# Patient Record
Sex: Female | Born: 2005 | Race: White | Hispanic: No | Marital: Single | Smoking: Never smoker
Health system: Southern US, Community
[De-identification: ages and names within clinical notes are randomized; demographics above are authoritative.]

## PROBLEM LIST (undated history)

## (undated) DIAGNOSIS — J45909 Unspecified asthma, uncomplicated: Secondary | ICD-10-CM

## (undated) DIAGNOSIS — G43909 Migraine, unspecified, not intractable, without status migrainosus: Secondary | ICD-10-CM

## (undated) DIAGNOSIS — I10 Essential (primary) hypertension: Secondary | ICD-10-CM

## (undated) HISTORY — DX: Unspecified asthma, uncomplicated: J45.909

## (undated) HISTORY — DX: Essential (primary) hypertension: I10

## (undated) HISTORY — DX: Migraine, unspecified, not intractable, without status migrainosus: G43.909

---
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
## 2024-03-08 DIAGNOSIS — S134XXA Sprain of ligaments of cervical spine, initial encounter: Secondary | ICD-10-CM | POA: Diagnosis not present

## 2024-03-08 DIAGNOSIS — W19XXXA Unspecified fall, initial encounter: Secondary | ICD-10-CM | POA: Diagnosis not present

## 2024-03-08 DIAGNOSIS — M542 Cervicalgia: Secondary | ICD-10-CM | POA: Diagnosis not present

## 2024-03-08 DIAGNOSIS — S300XXA Contusion of lower back and pelvis, initial encounter: Secondary | ICD-10-CM | POA: Diagnosis not present

## 2024-04-14 ENCOUNTER — Ambulatory Visit: Admitting: Physician Assistant

## 2024-04-16 ENCOUNTER — Ambulatory Visit (INDEPENDENT_AMBULATORY_CARE_PROVIDER_SITE_OTHER): Admitting: Physician Assistant

## 2024-04-16 ENCOUNTER — Encounter: Payer: Self-pay | Admitting: Physician Assistant

## 2024-04-16 VITALS — BP 106/62 | HR 78 | Temp 97.9°F | Ht 63.02 in | Wt 110.0 lb

## 2024-04-16 DIAGNOSIS — R627 Adult failure to thrive: Secondary | ICD-10-CM

## 2024-04-16 DIAGNOSIS — R899 Unspecified abnormal finding in specimens from other organs, systems and tissues: Secondary | ICD-10-CM

## 2024-04-16 NOTE — Progress Notes (Signed)
 Subjective:  Patient ID: Mckenzie Lucero, female    DOB: [birth year]  Age: 18 y.o. MRN: [id number]  Chief Complaint  Patient presents with   Weight Gain    HPI Pt in today for consult about weight - she states she would like to gain weight.  She has always had a petite frame and concerned about her size.  She eats 3 meals and one snack per day - does not always make healthy choices Last fall it was noted her hgb A1c was 5.8 but was eating very poorly - since then it has gone back to normal after cleaning up diet Weight today is 110 - weight in January 112    04/16/2024    1:11 PM 12/26/2023    9:15 AM 10/26/2023    9:08 AM 10/11/2023    2:05 PM 04/30/2023    3:32 PM  Depression screen PHQ 2/9  Decreased Interest 0 0 0 0 0  Down, Depressed, Hopeless 0 0 0 0 0  PHQ - 2 Score 0 0 0 0 0  Altered sleeping   0 0 0  Tired, decreased energy   1 0 1  Change in appetite   0 0 0  Feeling bad or failure about yourself    0 0 0  Trouble concentrating   0 0 0  Moving slowly or fidgety/restless   0 0 0  Suicidal thoughts   0 0   PHQ-9 Score   1 0 1  Difficult doing work/chores   Not difficult at all Not difficult at all         06/22/2022    7:43 AM 06/27/2023    9:56 AM 10/11/2023    2:05 PM 12/26/2023    9:15 AM 04/16/2024    1:11 PM  Fall Risk  Falls in the past year? 0 0 0 0 0  Was there an injury with Fall? 0 0 0 0 0  Fall Risk Category Calculator 0 0 0 0 0  Fall Risk Category (Retired) Low       (RETIRED) Patient Fall Risk Level Low fall risk       Patient at Risk for Falls Due to No Fall Risks No Fall Risks No Fall Risks No Fall Risks No Fall Risks  Fall risk Follow up Falls evaluation completed  Falls evaluation completed Falls evaluation completed  Falls evaluation completed     Data saved with a previous flowsheet row definition     ROS CONSTITUTIONAL: Negative for chills, fatigue, fever,  CARDIOVASCULAR: Negative for chest pain,  RESPIRATORY: Negative for recent cough and  dyspnea.  GASTROINTESTINAL: Negative for abdominal pain, acid reflux symptoms, constipation, diarrhea, nausea and vomiting.  PSYCHIATRIC: Negative for sleep disturbance and to question depression screen.  Negative for depression, negative for anhedonia.    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   Asthma    Blood pressure elevated without history of HTN 03/30/2017   Chronic constipation  06/13/2021   Gastroesophageal reflux disease 12/02/2015   History of 2019 novel coronavirus disease (COVID-19) 05/28/2020   Hypertension    Hypoglycemia 08/07/2023   Iron deficiency anemia due to chronic blood loss 03/21/2022   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   Menorrhagia with irregular cycle 03/21/2022   Migraine    Palpitations 08/07/2023   Recurrent vomiting 06/13/2021   Refused influenza vaccine 07/28/2020   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   Objective:  PHYSICAL EXAM:   BP 106/62   Pulse 78   Temp 97.9 F (36.6 C)   Ht 5' 3.02 (1.601 m)   Wt 110 lb (49.9 kg)   SpO2 98%   BMI 19.47 kg/m    GEN: Well nourished, well developed, in no acute distress  Cardiac: RRR; no murmurs,  Respiratory:  normal respiratory rate and pattern with no distress - normal breath sounds with no rales, rhonchi, wheezes or rubs GI: normal bowel sounds, no masses or tenderness Skin: warm and dry, no rash  Psych: euthymic mood, appropriate affect and demeanor  Assessment & Plan:    Poor  weight gain in adult -     CBC with Differential/Platelet -     Comprehensive metabolic panel with GFR -     TSH -     Hemoglobin A1c -     B12 and Folate Panel -     Iron, TIBC and Ferritin Panel -     Amb ref to Medical Nutrition Therapy-MNT  Abnormal laboratory test -     CBC with Differential/Platelet -     Comprehensive metabolic panel with GFR -     TSH -     Hemoglobin A1c -     B12 and Folate Panel -     Iron, TIBC and Ferritin Panel   Recommend eat more often and incorporate increase in protein in diet  Follow-up: Return in about 2 months (around 06/17/2024) for physical -- after 9/26.  An After Visit Summary was printed and given to the patient.  [name] [facility] [phone]

## 2024-04-17 ENCOUNTER — Ambulatory Visit: Payer: Self-pay | Admitting: Physician Assistant

## 2024-04-17 LAB — CBC WITH DIFFERENTIAL/PLATELET
Basophils Absolute: 0 x10E3/uL (ref 0.0–0.2)
Basos: 1 %
EOS (ABSOLUTE): 0.1 x10E3/uL (ref 0.0–0.4)
Eos: 1 %
Hematocrit: 40.7 % (ref 34.0–46.6)
Hemoglobin: 13.1 g/dL (ref 11.1–15.9)
Immature Grans (Abs): 0 x10E3/uL (ref 0.0–0.1)
Immature Granulocytes: 0 %
Lymphocytes Absolute: 2 x10E3/uL (ref 0.7–3.1)
Lymphs: 34 %
MCH: 29.2 pg (ref 26.6–33.0)
MCHC: 32.2 g/dL (ref 31.5–35.7)
MCV: 91 fL (ref 79–97)
Monocytes Absolute: 0.7 x10E3/uL (ref 0.1–0.9)
Monocytes: 11 %
Neutrophils Absolute: 3 x10E3/uL (ref 1.4–7.0)
Neutrophils: 53 %
Platelets: 230 x10E3/uL (ref 150–450)
RBC: 4.49 x10E6/uL (ref 3.77–5.28)
RDW: 11.8 % (ref 11.7–15.4)
WBC: 5.8 x10E3/uL (ref 3.4–10.8)

## 2024-04-17 LAB — COMPREHENSIVE METABOLIC PANEL WITH GFR
ALT: 10 IU/L (ref 0–32)
AST: 14 IU/L (ref 0–40)
Albumin: 4.6 g/dL (ref 4.0–5.0)
Alkaline Phosphatase: 100 IU/L (ref 42–106)
BUN/Creatinine Ratio: 9 (ref 9–23)
BUN: 7 mg/dL (ref 6–20)
Bilirubin Total: 0.3 mg/dL (ref 0.0–1.2)
CO2: 22 mmol/L (ref 20–29)
Calcium: 9.4 mg/dL (ref 8.7–10.2)
Chloride: 104 mmol/L (ref 96–106)
Creatinine, Ser: 0.8 mg/dL (ref 0.57–1.00)
Globulin, Total: 2.8 g/dL (ref 1.5–4.5)
Glucose: 82 mg/dL (ref 70–99)
Potassium: 3.8 mmol/L (ref 3.5–5.2)
Sodium: 141 mmol/L (ref 134–144)
Total Protein: 7.4 g/dL (ref 6.0–8.5)
eGFR: 109 mL/min/1.73 (ref >59)

## 2024-04-17 LAB — B12 AND FOLATE PANEL
Folate: 7.1 ng/mL (ref >3.0)
Vitamin B-12: 328 pg/mL (ref 232–1245)

## 2024-04-17 LAB — IRON,TIBC AND FERRITIN PANEL
Ferritin: 19 ng/mL (ref 15–77)
Iron Saturation: 23 % (ref 15–55)
Iron: 71 ug/dL (ref 27–159)
Total Iron Binding Capacity: 307 ug/dL (ref 250–450)
UIBC: 236 ug/dL (ref 131–425)

## 2024-04-17 LAB — HEMOGLOBIN A1C
Est. average glucose Bld gHb Est-mCnc: 111 mg/dL
Hgb A1c MFr Bld: 5.5 % (ref 4.8–5.6)

## 2024-04-17 LAB — TSH: TSH: 0.802 u[IU]/mL (ref 0.450–4.500)

## 2024-05-26 ENCOUNTER — Encounter: Payer: Self-pay | Admitting: Physician Assistant

## 2024-05-26 ENCOUNTER — Ambulatory Visit (INDEPENDENT_AMBULATORY_CARE_PROVIDER_SITE_OTHER): Admitting: Physician Assistant

## 2024-05-26 ENCOUNTER — Telehealth: Payer: Self-pay

## 2024-05-26 VITALS — BP 128/74 | HR 69 | Temp 97.2°F | Ht 63.02 in | Wt 112.0 lb

## 2024-05-26 DIAGNOSIS — M62830 Muscle spasm of back: Secondary | ICD-10-CM | POA: Insufficient documentation

## 2024-05-26 MED ORDER — CYCLOBENZAPRINE HCL 5 MG PO TABS
5.0000 mg | ORAL_TABLET | Freq: Three times a day (TID) | ORAL | 1 refills | Status: DC | PRN
Start: 2024-05-26 — End: 2024-05-26

## 2024-05-26 MED ORDER — CYCLOBENZAPRINE HCL 5 MG PO TABS: 5.0000 mg | ORAL_TABLET | Freq: Three times a day (TID) | ORAL | 1 refills | Status: AC | PRN

## 2024-05-26 MED ORDER — PREDNISONE 20 MG PO TABS: ORAL_TABLET | ORAL | 0 refills | Status: DC

## 2024-05-26 MED ORDER — PREDNISONE 20 MG PO TABS: ORAL_TABLET | ORAL | 0 refills | Status: AC

## 2024-05-26 NOTE — Assessment & Plan Note (Signed)
 Acute muscle spasm with nerve impingement in the right upper back and neck, causing numbness and tingling. Likely due to muscle spasm affecting the long thoracic nerve. Possible contributing factors include dehydration or electrolyte imbalance. - Prescribed low-dose muscle relaxer for nighttime use. - Advised on hydration to prevent muscle cramps. - Recommended alternating heat and cold therapy for pain and inflammation management. - Consider physical therapy if symptoms persist or recur. - Prescribed five-day prednisone  taper for nerve inflammation. - Advised on self-massage techniques with foam roller or tennis ball. - Sent prescriptions to [facility] on [address].

## 2024-05-26 NOTE — Addendum Note (Signed)
 Addended by: Derrick Orris M on: 05/26/2024 01:34 PM   Modules accepted: Orders

## 2024-05-26 NOTE — Telephone Encounter (Signed)
 error

## 2024-05-26 NOTE — Progress Notes (Signed)
 Acute Office Visit  Subjective:    Patient ID: Mckenzie Lucero, female    DOB: 25-Dec-2005, 18 y.o.   MRN: [id number]  Chief Complaint  Patient presents with   Left arm numbness    HPI: Patient is in today for left arm numbness Discussed the use of AI scribe software for clinical note transcription with the patient, who gave verbal consent to proceed.  History of Present Illness   Mckenzie Lucero is an 18 year old female who presents with numbness and tingling in the shoulder and arm. She is accompanied by a family member.  She began experiencing numbness and tingling in her shoulder and arm yesterday. The sensation, described as 'pins and needles', starts around her shoulder blade and extends down her arm into her fingers. No recent injury or unusual physical activity that might have triggered the symptoms.  She attempted to alleviate the symptoms with ibuprofen, Tylenol , and a topical cream, but these did not provide relief. No previous episodes of similar symptoms and reports a sensation of tightness in the affected area.  No chest pain, shortness of breath, dizziness, or thyroid  issues. No history of neck injury.       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   Asthma    Blood pressure elevated without history of HTN 03/30/2017   Chronic constipation 06/13/2021   Gastroesophageal reflux disease 12/02/2015   History of 2019 novel coronavirus disease (COVID-19) 05/28/2020   Hypertension    Hypoglycemia 08/07/2023   Iron deficiency anemia due to chronic blood loss 03/21/2022   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   Menorrhagia with irregular cycle 03/21/2022   Migraine    Palpitations 08/07/2023   Recurrent vomiting 06/13/2021   Refused influenza vaccine 07/28/2020   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    Past Surgical History:  Procedure Laterality Date   COLONOSCOPY   12/27/2021   Ruled Out Crohns   UPPER GI ENDOSCOPY  12/27/2021    Family History  Problem Relation Age of Onset   Migraines Mother    Heart murmur Mother    Migraines Father    Heart murmur Father    Depression Father    Stroke Other    Heart attack Other     Social History   Socioeconomic History   Marital status: Single    Spouse name: Not on file   Number of children: Not on file   Years of education: Not on file   Highest education level: Not on file  Occupational History   Not on file  Tobacco Use   Smoking status: Never   Smokeless tobacco: Never  Vaping Use   Vaping status: Former  Substance and Sexual Activity   Alcohol use: Never   Drug use: Never   Sexual activity: Never    Birth control/protection: Injection  Other Topics Concern   Not on file  Social History Narrative   Not on file   Social Drivers of Health   Financial Resource Strain: Low Risk  (06/27/2023)   Overall Financial Resource Strain (CARDIA)    Difficulty of Paying Living Expenses: Not hard at all  Food Insecurity: No Food Insecurity (06/27/2023)   Hunger Vital Sign    Worried About Running Out of Food in the Last Year: Never true    Ran Out of Food in the Last Year: Never true  Transportation Needs: No Transportation Needs (06/27/2023)  PRAPARE - Administrator, Civil Service (Medical): No    Lack of Transportation (Non-Medical): No  Physical Activity: Sufficiently Active (06/27/2023)   Exercise Vital Sign    Days of Exercise per Week: 5 days    Minutes of Exercise per Session: 60 min  Stress: No Stress Concern Present (06/27/2023)   [employer] of Occupational Health - Occupational Stress Questionnaire    Feeling of Stress : Not at all  Social Connections: Socially Isolated (06/27/2023)   Social Connection and Isolation Panel    Frequency of Communication with Friends and Family: More than three times a week    Frequency of Social Gatherings with Friends and Family:  More than three times a week    Attends Religious Services: Never    Database administrator or Organizations: No    Attends Banker Meetings: Never    Marital Status: Never married  Intimate Partner Violence: Not At Risk (06/27/2023)   Humiliation, Afraid, Rape, and Kick questionnaire    Fear of Current or Ex-Partner: No    Emotionally Abused: No    Physically Abused: No    Sexually Abused: No    Outpatient Medications Prior to Visit  Medication Sig Dispense Refill   doxycycline  (VIBRA -TABS) 100 MG tablet Take 1 tablet (100 mg total) by mouth daily.     pantoprazole  (PROTONIX ) 40 MG tablet TAKE 1 TABLET(40 MG) BY MOUTH DAILY (Patient not taking: Reported on 04/16/2024) 90 tablet 1   promethazine  (PHENERGAN ) 25 MG tablet TAKE 1 TABLET(25 MG) BY MOUTH EVERY 8 HOURS AS NEEDED FOR NAUSEA OR VOMITING 20 tablet 0   No facility-administered medications prior to visit.    Allergies  Allergen Reactions   Penicillin G Rash    Review of Systems  Constitutional:  Negative for appetite change, fatigue and fever.  HENT:  Negative for congestion, ear pain, sinus pressure and sore throat.   Respiratory:  Negative for cough, chest tightness, shortness of breath and wheezing.   Cardiovascular:  Negative for chest pain and palpitations.  Gastrointestinal:  Negative for abdominal pain, constipation, diarrhea, nausea and vomiting.  Genitourinary:  Negative for dysuria and hematuria.  Musculoskeletal:  Negative for arthralgias, back pain, joint swelling and myalgias.  Skin:  Negative for rash.  Neurological:  Positive for numbness (Left arm numbness). Negative for dizziness, weakness and headaches.  Psychiatric/Behavioral:  Negative for dysphoric mood. The patient is not nervous/anxious.        Objective:        05/26/2024   11:11 AM 04/16/2024    1:09 PM 12/26/2023    9:15 AM  Vitals with BMI  Height 5' 3.02 5' 3.02 5' 3  Weight 112 lbs 110 lbs 110 lbs  BMI 19.82 19.47 19.49   Systolic 128 106 891  Diastolic 74 62 80  Pulse 69 78 65    Orthostatic VS for the past 72 hrs (Last 3 readings):  Patient Position BP Location  05/26/24 1111 Sitting Left Arm     Physical Exam Vitals reviewed.  Constitutional:      Appearance: Normal appearance.  Neck:     Thyroid : No thyroid  mass, thyromegaly or thyroid  tenderness.  Cardiovascular:     Rate and Rhythm: Normal rate and regular rhythm.     Heart sounds: Normal heart sounds.  Pulmonary:     Effort: Pulmonary effort is normal.     Breath sounds: Normal breath sounds.  Abdominal:     General: Bowel sounds are  normal.     Palpations: Abdomen is soft.     Tenderness: There is no abdominal tenderness.  Musculoskeletal:     Cervical back: Spasms and tenderness present. Muscular tenderness present. No spinous process tenderness. Decreased range of motion.     Thoracic back: Spasms and tenderness present. No swelling, edema or signs of trauma.       Back:  Neurological:     Mental Status: She is alert and oriented to person, place, and time.  Psychiatric:        Mood and Affect: Mood normal.        Behavior: Behavior normal.     Health Maintenance Due  Topic Date Due   INFLUENZA VACCINE  05/02/2024    There are no preventive care reminders to display for this patient.   Lab Results  Component Value Date   TSH 0.802 04/16/2024   Lab Results  Component Value Date   WBC 5.8 04/16/2024   HGB 13.1 04/16/2024   HCT 40.7 04/16/2024   MCV 91 04/16/2024   PLT 230 04/16/2024   Lab Results  Component Value Date   NA 141 04/16/2024   K 3.8 04/16/2024   CO2 22 04/16/2024   GLUCOSE 82 04/16/2024   BUN 7 04/16/2024   CREATININE 0.80 04/16/2024   BILITOT 0.3 04/16/2024   ALKPHOS 100 04/16/2024   AST 14 04/16/2024   ALT 10 04/16/2024   PROT 7.4 04/16/2024   ALBUMIN 4.6 04/16/2024   CALCIUM 9.4 04/16/2024   EGFR 109 04/16/2024   Lab Results  Component Value Date   CHOL 150 07/16/2023   Lab  Results  Component Value Date   HDL 57 07/16/2023   Lab Results  Component Value Date   LDLCALC 76 07/16/2023   Lab Results  Component Value Date   TRIG 92 (H) 07/16/2023   Lab Results  Component Value Date   CHOLHDL 2.6 07/16/2023   Lab Results  Component Value Date   HGBA1C 5.5 04/16/2024       Assessment & Plan:  Spasm of thoracic back muscle Assessment & Plan: Acute muscle spasm with nerve impingement in the right upper back and neck, causing numbness and tingling. Likely due to muscle spasm affecting the long thoracic nerve. Possible contributing factors include dehydration or electrolyte imbalance. - Prescribed low-dose muscle relaxer for nighttime use. - Advised on hydration to prevent muscle cramps. - Recommended alternating heat and cold therapy for pain and inflammation management. - Consider physical therapy if symptoms persist or recur. - Prescribed five-day prednisone  taper for nerve inflammation. - Advised on self-massage techniques with foam roller or tennis ball. - Sent prescriptions to [facility] on [address].  Orders: -     Cyclobenzaprine  HCl; Take 1 tablet (5 mg total) by mouth 3 (three) times daily as needed for muscle spasms.  Dispense: 30 tablet; Refill: 1 -     predniSONE ; Take 3 tablets (60 mg total) by mouth daily with breakfast for 3 days, THEN 2 tablets (40 mg total) daily with breakfast for 3 days, THEN 1 tablet (20 mg total) daily with breakfast for 3 days.  Dispense: 18 tablet; Refill: 0     Meds ordered this encounter  Medications   cyclobenzaprine  (FLEXERIL ) 5 MG tablet    Sig: Take 1 tablet (5 mg total) by mouth 3 (three) times daily as needed for muscle spasms.    Dispense:  30 tablet    Refill:  1   predniSONE  (DELTASONE ) 20 MG tablet  Sig: Take 3 tablets (60 mg total) by mouth daily with breakfast for 3 days, THEN 2 tablets (40 mg total) daily with breakfast for 3 days, THEN 1 tablet (20 mg total) daily with breakfast for 3 days.     Dispense:  18 tablet    Refill:  0    No orders of the defined types were placed in this encounter.    Follow-up: Return if symptoms worsen or fail to improve.  An After Visit Summary was printed and given to the patient.    I,Lauren M Auman,acting as a Neurosurgeon for [employer], [employer].,have documented all relevant documentation on the behalf of Nola Angles, PA,as directed by  Nola Angles, PA while in the presence of Nola Angles, [place].    Nola Angles, [place] [facility] [phone]

## 2024-06-20 ENCOUNTER — Ambulatory Visit (INDEPENDENT_AMBULATORY_CARE_PROVIDER_SITE_OTHER): Payer: Self-pay | Admitting: Physician Assistant

## 2024-06-20 ENCOUNTER — Encounter: Payer: Self-pay | Admitting: Physician Assistant

## 2024-06-20 VITALS — BP 110/68 | HR 69 | Temp 97.5°F | Ht 63.02 in | Wt 112.4 lb

## 2024-06-20 DIAGNOSIS — K581 Irritable bowel syndrome with constipation: Secondary | ICD-10-CM

## 2024-06-20 MED ORDER — DICYCLOMINE HCL 20 MG PO TABS: 20.0000 mg | ORAL_TABLET | Freq: Three times a day (TID) | ORAL | 1 refills | Status: AC

## 2024-06-20 NOTE — Progress Notes (Signed)
 Subjective:  Patient ID: Mckenzie Lucero, female    DOB: 05/24/2006  Age: 18 y.o. MRN: [id number]  Chief Complaint  Patient presents with   GI Problem    HPI Pt states she has had trouble with her bowels intermittently over past few years.  Last year had issues with IBS and advised to increase water and fiber in diet.  She states she is doing well with that but more recently having more stomach cramps and going several days between bowel movements.  When she does go Bms are loose.  Denies fever, vomiting.  No pain today Denies urine symptoms.  LMP few weeks ago and was normal     06/20/2024   10:37 AM 04/16/2024    1:11 PM 12/26/2023    9:15 AM 10/26/2023    9:08 AM 10/11/2023    2:05 PM  Depression screen PHQ 2/9  Decreased Interest 0 0 0 0 0  Down, Depressed, Hopeless 0 0 0 0 0  PHQ - 2 Score 0 0 0 0 0  Altered sleeping    0 0  Tired, decreased energy    1 0  Change in appetite    0 0  Feeling bad or failure about yourself     0 0  Trouble concentrating    0 0  Moving slowly or fidgety/restless    0 0  Suicidal thoughts    0 0  PHQ-9 Score    1 0  Difficult doing work/chores    Not difficult at all Not difficult at all        06/27/2023    9:56 AM 10/11/2023    2:05 PM 12/26/2023    9:15 AM 04/16/2024    1:11 PM 06/20/2024   10:37 AM  Fall Risk  Falls in the past year? 0 0 0 0 0  Was there an injury with Fall? 0 0 0 0 0  Fall Risk Category Calculator 0 0 0 0 0  Patient at Risk for Falls Due to No Fall Risks No Fall Risks No Fall Risks No Fall Risks No Fall Risks  Fall risk Follow up Falls evaluation completed Falls evaluation completed  Falls evaluation completed Falls evaluation completed     ROS CONSTITUTIONAL: Negative for chills, fatigue, fever, unintentional weight gain and unintentional weight loss.  CARDIOVASCULAR: Negative for chest pain, RESPIRATORY: Negative for recent cough and dyspnea.  GASTROINTESTINAL: see HPI    Current Outpatient Medications:     dicyclomine  (BENTYL ) 20 MG tablet, Take 1 tablet (20 mg total) by mouth 3 (three) times daily before meals., Disp: 90 tablet, Rfl: 1   doxycycline  (ADOXA) 100 MG tablet, Take 100 mg by mouth daily., Disp: , Rfl:    ondansetron (ZOFRAN-ODT) 4 MG disintegrating tablet, Take 4 mg by mouth every 8 (eight) hours as needed for nausea or vomiting., Disp: , Rfl:    cyclobenzaprine  (FLEXERIL ) 5 MG tablet, Take 1 tablet (5 mg total) by mouth 3 (three) times daily as needed for muscle spasms. 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   Asthma    Blood pressure elevated without history of HTN 03/30/2017   Chronic constipation 06/13/2021   Gastroesophageal reflux disease 12/02/2015   History of 2019 novel coronavirus disease (COVID-19) 05/28/2020   Hypertension    Hypoglycemia 08/07/2023   Iron deficiency anemia due  to chronic blood loss 03/21/2022   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   Menorrhagia with irregular cycle 03/21/2022   Migraine    Palpitations 08/07/2023   Recurrent vomiting 06/13/2021   Refused influenza vaccine 07/28/2020   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   Objective:  PHYSICAL EXAM:   BP 110/68   Pulse 69   Temp (!) 97.5 F (36.4 C)   Ht 5' 3.02 (1.601 m)   Wt 112 lb 6.4 oz (51 kg)   SpO2 99%   BMI 19.90 kg/m    GEN: Well nourished, well developed, in no acute distress  Cardiac: RRR; no murmurs, Respiratory:  normal respiratory rate and pattern with no distress - normal breath sounds with no rales, rhonchi, wheezes or rubs GI: normal bowel sounds, no masses or tenderness Psych: euthymic mood, appropriate affect and demeanor  Assessment & Plan:    Irritable bowel syndrome with constipation -     Dicyclomine  HCl; Take 1 tablet (20 mg total) by mouth 3 (three) times daily before meals.  Dispense: 90  tablet; Refill: 1 Increase water and fiber intake    Follow-up: Return if symptoms worsen or fail to improve.  An After Visit Summary was printed and given to the patient.  [name] [facility] [phone]

## 2024-07-02 ENCOUNTER — Encounter: Admitting: Physician Assistant

## 2024-07-03 ENCOUNTER — Other Ambulatory Visit: Payer: Self-pay | Admitting: Physician Assistant

## 2024-07-03 MED ORDER — DOXYCYCLINE MONOHYDRATE 100 MG PO TABS: 100.0000 mg | ORAL_TABLET | Freq: Every day | ORAL | 1 refills | Status: AC

## 2024-07-03 NOTE — Telephone Encounter (Signed)
 Copied from CRM [id number]. Topic: Clinical - Medication Refill >> Jul 03, 2024  9:18 AM Carlatta H wrote: Medication: doxycycline  (ADOXA) 100 MG tablet  Has the patient contacted their pharmacy? No (Agent: If no, request that the patient contact the pharmacy for the refill. If patient does not wish to contact the pharmacy document the reason why and proceed with request.) (Agent: If yes, when and what did the pharmacy advise?)  This is the patient's preferred pharmacy:  [id number] [place], [place] - [address] AT [facility] [address] [place] [address] Phone: [phone] Fax: [phone]   Is this the correct pharmacy for this prescription? Yes If no, delete pharmacy and type the correct one.   Has the prescription been filled recently? No  Is the patient out of the medication? Yes  Has the patient been seen for an appointment in the last year OR does the patient have an upcoming appointment? Yes  Can we respond through MyChart? No  Agent: Please be advised that Rx refills may take up to 3 business days. We ask that you follow-up with your pharmacy.

## 2024-07-07 ENCOUNTER — Telehealth: Payer: Self-pay | Admitting: Physician Assistant

## 2024-07-07 NOTE — Telephone Encounter (Signed)
 [facility] called, on hold long time. Patient called and advised it was sent to [facility] on 07/03/24 #90/1 refill. She verbalized understanding. She says she has a message to call and speak to [place] about a physical. Advised there are 2 appointments for a physical scheduled and I will connect the call to the office, called and spoke to [place], call connected.  Copied from CRM [id number]. Topic: Clinical - Medication Refill >> Jul 07, 2024 11:31 AM Amy B wrote: Patient called requesting refill of her doxycycline  (ADOXA) 100 MG tablet.

## 2024-07-11 ENCOUNTER — Encounter: Payer: Self-pay | Admitting: Physician Assistant

## 2024-07-14 ENCOUNTER — Ambulatory Visit: Payer: Self-pay | Admitting: Family Medicine

## 2024-07-14 ENCOUNTER — Encounter: Payer: Self-pay | Admitting: Family Medicine

## 2024-07-14 VITALS — BP 104/68 | HR 64 | Temp 98.2°F | Resp 18 | Ht 63.0 in | Wt 115.6 lb

## 2024-07-14 DIAGNOSIS — J069 Acute upper respiratory infection, unspecified: Secondary | ICD-10-CM | POA: Insufficient documentation

## 2024-07-14 NOTE — Assessment & Plan Note (Addendum)
 Acute upper respiratory infection Likely viral etiology with nasal congestion and mild sore throat. - Provided Mucinex samples with decongestant. - Provided Zyrtec samples for antihistamine effect. - Advised increased fluid intake. - Recommended changing toothbrush after symptom resolution. - Suggested warm fluids like teas or honey and lemon for soothing. - Advised use of humidified air to loosen congestion. - Instructed to contact if symptoms persist for antibiotic consideration.

## 2024-07-14 NOTE — Progress Notes (Signed)
 Subjective:  Patient ID: Mckenzie Lucero, female    DOB: 2005/10/16  Age: 18 y.o. MRN: [id number]  Chief Complaint  Patient presents with   Nasal Congestion   Ear Pain   Discussed the use of AI scribe software for clinical note transcription with the patient, who gave verbal consent to proceed.  History of Present Illness   Mckenzie Lucero is a 18 year old female who presents with nasal congestion.  Nasal congestion and upper respiratory symptoms - Nasal congestion for the past two days - Nose feels significantly congested - No general malaise - No recent contact with sick individuals; family had a cold a few weeks ago - No ear pain or tenderness when bending forward  Pharyngitis and associated symptoms - No current sore throat - Mild sore throat on the first day of symptoms, attributed to postnasal drainage - No gastrointestinal symptoms  Symptom management and medications - Using Alka-Seltzer and Nyquil for symptom relief - Has not tried Flonase or antihistamines - Currently taking doxycycline  for acne         06/20/2024   10:37 AM 04/16/2024    1:11 PM 12/26/2023    9:15 AM 10/26/2023    9:08 AM 10/11/2023    2:05 PM  Depression screen PHQ 2/9  Decreased Interest 0 0 0 0 0  Down, Depressed, Hopeless 0 0 0 0 0  PHQ - 2 Score 0 0 0 0 0  Altered sleeping    0 0  Tired, decreased energy    1 0  Change in appetite    0 0  Feeling bad or failure about yourself     0 0  Trouble concentrating    0 0  Moving slowly or fidgety/restless    0 0  Suicidal thoughts    0 0  PHQ-9 Score    1 0  Difficult doing work/chores    Not difficult at all Not difficult at all        06/20/2024   10:37 AM  Fall Risk   Falls in the past year? 0  Number falls in past yr: 0  Injury with Fall? 0  Risk for fall due to : No Fall Risks  Follow up Falls evaluation completed    Patient Care Team: Nicholaus Credit, PA-C as PCP - General (Physician Assistant) Cheron Annabella Houston, MD  (Pediatrics)   Review of Systems  Constitutional:  Positive for chills. Negative for diaphoresis, fatigue and fever.  HENT:  Positive for congestion, ear pain, postnasal drip, rhinorrhea, sinus pressure and sinus pain.   Eyes: Negative.   Respiratory:  Positive for shortness of breath. Negative for cough and chest tightness.   Cardiovascular:  Negative for chest pain.  Gastrointestinal:  Positive for nausea. Negative for abdominal pain, constipation, diarrhea and vomiting.  Endocrine: Negative.   Genitourinary:  Negative for dysuria, frequency and urgency.  Musculoskeletal:  Negative for arthralgias.  Allergic/Immunologic: Negative.   Neurological:  Negative for dizziness, weakness, light-headedness and headaches.  Hematological: Negative.   Psychiatric/Behavioral:  Negative for dysphoric mood. The patient is not nervous/anxious.     Current Outpatient Medications on File Prior to Visit  Medication Sig Dispense Refill   doxycycline  (ADOXA) 100 MG tablet Take 1 tablet (100 mg total) by mouth daily. 90 tablet 1   ondansetron (ZOFRAN-ODT) 4 MG disintegrating tablet Take 4 mg by mouth every 8 (eight) hours as needed for nausea or vomiting.     cyclobenzaprine  (FLEXERIL ) 5 MG tablet Take 1  tablet (5 mg total) by mouth 3 (three) times daily as needed for muscle spasms. (Patient not taking: Reported on 07/14/2024) 30 tablet 1   dicyclomine  (BENTYL ) 20 MG tablet Take 1 tablet (20 mg total) by mouth 3 (three) times daily before meals. (Patient not taking: Reported on 07/14/2024) 90 tablet 1   No current facility-administered medications on file prior to visit.   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   Asthma    Blood pressure elevated without history of HTN 03/30/2017   Chronic constipation 06/13/2021   Gastroesophageal reflux disease 12/02/2015   History of 2019 novel coronavirus disease (COVID-19)  05/28/2020   Hypertension    Hypoglycemia 08/07/2023   Iron deficiency anemia due to chronic blood loss 03/21/2022   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   Menorrhagia with irregular cycle 03/21/2022   Migraine    Palpitations 08/07/2023   Recurrent vomiting 06/13/2021   Refused influenza vaccine 07/28/2020   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   Past Surgical History:  Procedure Laterality Date   COLONOSCOPY  12/27/2021   Ruled Out Crohns   UPPER GI ENDOSCOPY  12/27/2021    Family History  Problem Relation Age of Onset   Migraines Mother    Heart murmur Mother    Migraines Father    Heart murmur Father    Depression Father    Stroke Other    Heart attack Other    Social History   Socioeconomic History   Marital status: Single    Spouse name: Not on file   Number of children: Not on file   Years of education: Not on file   Highest education level: Not on file  Occupational History   Not on file  Tobacco Use   Smoking status: Never   Smokeless tobacco: Never  Vaping Use   Vaping status: Former  Substance and Sexual Activity   Alcohol use: Never   Drug use: Never   Sexual activity: Never    Birth control/protection: Injection  Other Topics Concern   Not on file  Social History Narrative   Not on file   Social Drivers of Health   Financial Resource Strain: Low Risk  (06/27/2023)   Overall Financial Resource Strain (CARDIA)    Difficulty of Paying Living Expenses: Not hard at all  Food Insecurity: No Food Insecurity (06/27/2023)   Hunger Vital Sign    Worried About Running Out of Food in the Last Year: Never true    Ran Out of Food in the Last Year: Never true  Transportation Needs: No Transportation Needs (06/27/2023)   PRAPARE - Administrator, Civil Service (Medical): No    Lack of Transportation (Non-Medical): No  Physical Activity: Sufficiently Active (06/27/2023)   Exercise Vital Sign    Days of Exercise per Week: 5 days    Minutes  of Exercise per Session: 60 min  Stress: No Stress Concern Present (06/27/2023)   [employer] of Occupational Health - Occupational Stress Questionnaire    Feeling of Stress : Not at all  Social Connections: Socially Isolated (06/27/2023)   Social Connection and Isolation Panel    Frequency of Communication with Friends and Family: More than three times a week    Frequency of Social Gatherings with Friends and Family: More than three times a week    Attends Religious Services: Never    Database administrator or Organizations:  No    Attends Banker Meetings: Never    Marital Status: Never married    Objective:  BP 104/68   Pulse 64   Temp 98.2 F (36.8 C) (Temporal)   Resp 18   Ht 5' 3 (1.6 m)   Wt 115 lb 9.6 oz (52.4 kg)   SpO2 97%   BMI 20.48 kg/m      07/14/2024    9:15 AM 06/20/2024   10:34 AM 05/26/2024   11:11 AM  BP/Weight  Systolic BP 104 110 128  Diastolic BP 68 68 74  Wt. (Lbs) 115.6 112.4 112  BMI 20.48 kg/m2 19.9 kg/m2 19.83 kg/m2    Physical Exam Vitals reviewed.  Constitutional:      General: She is not in acute distress.    Appearance: Normal appearance. She is normal weight. She is not ill-appearing.  HENT:     Right Ear: Hearing, tympanic membrane, ear canal and external ear normal.     Left Ear: Hearing, tympanic membrane, ear canal and external ear normal.     Mouth/Throat:     Pharynx: No posterior oropharyngeal erythema.  Eyes:     Conjunctiva/sclera: Conjunctivae normal.  Neck:     Vascular: No carotid bruit.  Cardiovascular:     Rate and Rhythm: Normal rate and regular rhythm.     Heart sounds: Normal heart sounds. No murmur heard. Pulmonary:     Effort: Pulmonary effort is normal. No respiratory distress.     Breath sounds: Normal breath sounds. No wheezing or rhonchi.  Abdominal:     General: Bowel sounds are normal.     Palpations: Abdomen is soft.     Tenderness: There is no abdominal tenderness.  Musculoskeletal:         General: Normal range of motion.  Skin:    General: Skin is warm.  Neurological:     Mental Status: She is alert. Mental status is at baseline.  Psychiatric:        Mood and Affect: Mood normal.        Behavior: Behavior normal.     Lab Results  Component Value Date   WBC 5.8 04/16/2024   HGB 13.1 04/16/2024   HCT 40.7 04/16/2024   PLT 230 04/16/2024   GLUCOSE 82 04/16/2024   CHOL 150 07/16/2023   TRIG 92 (H) 07/16/2023   HDL 57 07/16/2023   LDLCALC 76 07/16/2023   ALT 10 04/16/2024   AST 14 04/16/2024   NA 141 04/16/2024   K 3.8 04/16/2024   CL 104 04/16/2024   CREATININE 0.80 04/16/2024   BUN 7 04/16/2024   CO2 22 04/16/2024   TSH 0.802 04/16/2024   HGBA1C 5.5 04/16/2024    Results for orders placed or performed in visit on 04/16/24  CBC with Differential/Platelet   Collection Time: 04/16/24  1:37 PM  Result Value Ref Range   WBC 5.8 3.4 - 10.8 x10E3/uL   RBC 4.49 3.77 - 5.28 x10E6/uL   Hemoglobin 13.1 11.1 - 15.9 g/dL   Hematocrit 59.2 65.9 - 46.6 %   MCV 91 79 - 97 fL   MCH 29.2 26.6 - 33.0 pg   MCHC 32.2 31.5 - 35.7 g/dL   RDW 88.1 88.2 - 84.5 %   Platelets 230 150 - 450 x10E3/uL   Neutrophils 53 Not Estab. %   Lymphs 34 Not Estab. %   Monocytes 11 Not Estab. %   Eos 1 Not Estab. %   Basos 1 Not  Estab. %   Neutrophils Absolute 3.0 1.4 - 7.0 x10E3/uL   Lymphocytes Absolute 2.0 0.7 - 3.1 x10E3/uL   Monocytes Absolute 0.7 0.1 - 0.9 x10E3/uL   EOS (ABSOLUTE) 0.1 0.0 - 0.4 x10E3/uL   Basophils Absolute 0.0 0.0 - 0.2 x10E3/uL   Immature Granulocytes 0 Not Estab. %   Immature Grans (Abs) 0.0 0.0 - 0.1 x10E3/uL  Comprehensive metabolic panel with GFR   Collection Time: 04/16/24  1:37 PM  Result Value Ref Range   Glucose 82 70 - 99 mg/dL   BUN 7 6 - 20 mg/dL   Creatinine, Ser 9.19 0.57 - 1.00 mg/dL   eGFR 890 >40 fO/fpw/8.26   BUN/Creatinine Ratio 9 9 - 23   Sodium 141 134 - 144 mmol/L   Potassium 3.8 3.5 - 5.2 mmol/L   Chloride 104 96 - 106  mmol/L   CO2 22 20 - 29 mmol/L   Calcium 9.4 8.7 - 10.2 mg/dL   Total Protein 7.4 6.0 - 8.5 g/dL   Albumin 4.6 4.0 - 5.0 g/dL   Globulin, Total 2.8 1.5 - 4.5 g/dL   Bilirubin Total 0.3 0.0 - 1.2 mg/dL   Alkaline Phosphatase 100 42 - 106 IU/L   AST 14 0 - 40 IU/L   ALT 10 0 - 32 IU/L  TSH   Collection Time: 04/16/24  1:37 PM  Result Value Ref Range   TSH 0.802 0.450 - 4.500 uIU/mL  Hemoglobin A1c   Collection Time: 04/16/24  1:37 PM  Result Value Ref Range   Hgb A1c MFr Bld 5.5 4.8 - 5.6 %   Est. average glucose Bld gHb Est-mCnc 111 mg/dL  A87 and Folate Panel   Collection Time: 04/16/24  1:37 PM  Result Value Ref Range   Vitamin B-12 328 232 - 1,245 pg/mL   Folate 7.1 >3.0 ng/mL  Iron, TIBC and Ferritin Panel   Collection Time: 04/16/24  1:37 PM  Result Value Ref Range   Total Iron Binding Capacity 307 250 - 450 ug/dL   UIBC 763 868 - 574 ug/dL   Iron 71 27 - 840 ug/dL   Iron Saturation 23 15 - 55 %   Ferritin 19 15 - 77 ng/mL  .  Assessment & Plan:   Assessment & Plan Upper respiratory tract infection, unspecified type Acute upper respiratory infection Likely viral etiology with nasal congestion and mild sore throat. - Provided Mucinex samples with decongestant. - Provided Zyrtec samples for antihistamine effect. - Advised increased fluid intake. - Recommended changing toothbrush after symptom resolution. - Suggested warm fluids like teas or honey and lemon for soothing. - Advised use of humidified air to loosen congestion. - Instructed to contact if symptoms persist for antibiotic consideration.       Follow-up: Return if symptoms worsen or fail to improve.  An After Visit Summary was printed and given to the patient.  Harrie Cedar, FNP [facility] [phone]

## 2024-07-14 NOTE — Patient Instructions (Signed)
  VISIT SUMMARY: Today, you were seen for nasal congestion and symptoms of an upper respiratory infection. You have been experiencing nasal congestion for the past two days, but you do not have a sore throat or other significant symptoms. You have been using Alka-Seltzer and Nyquil for relief and are currently taking doxycycline .  YOUR PLAN: -ACUTE UPPER RESPIRATORY INFECTION: An acute upper respiratory infection is a viral infection that affects the nose and throat, causing symptoms like nasal congestion and a mild sore throat. To help manage your symptoms, I provided you with Mucinex and Zyrtec samples. You should increase your fluid intake, change your toothbrush after your symptoms resolve, and try warm fluids like teas or honey and lemon for soothing. Using humidified air can also help loosen congestion. If your symptoms persist, please contact us  for further evaluation and possible antibiotic treatment.  INSTRUCTIONS: Please follow the recommendations provided for managing your symptoms. If your symptoms persist, contact us  for further evaluation and possible antibiotic treatment.                      Contains text generated by Abridge.                                 Contains text generated by Abridge.

## 2024-07-29 ENCOUNTER — Encounter: Admitting: Physician Assistant
